# Patient Record
Sex: Male | Born: 2000 | Race: White | Hispanic: Yes | Marital: Single | Smoking: Never smoker
Health system: Southern US, Community
[De-identification: ages and names within clinical notes are randomized; demographics above are authoritative.]

## PROBLEM LIST (undated history)

## (undated) DIAGNOSIS — J45909 Unspecified asthma, uncomplicated: Secondary | ICD-10-CM

---
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
## 2021-06-14 ENCOUNTER — Other Ambulatory Visit (HOSPITAL_BASED_OUTPATIENT_CLINIC_OR_DEPARTMENT_OTHER): Payer: Self-pay

## 2021-06-14 MED ORDER — INFLUENZA VAC SPLIT QUAD 0.5 ML IM SUSY
PREFILLED_SYRINGE | INTRAMUSCULAR | 0 refills | Status: AC
  Filled 2021-06-14: qty 0.5, 1d supply, fill #0
